# Patient Record
Sex: Male | Born: 1990 | Race: Black or African American | Hispanic: No | Marital: Single | Smoking: Former smoker
Health system: Southern US, Community
[De-identification: ages and names within clinical notes are randomized; demographics above are authoritative.]

## PROBLEM LIST (undated history)

## (undated) DIAGNOSIS — J40 Bronchitis, not specified as acute or chronic: Secondary | ICD-10-CM

## (undated) HISTORY — PX: CYST EXCISION: SHX5701

---
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
## 2010-06-16 ENCOUNTER — Emergency Department (HOSPITAL_COMMUNITY): Admission: EM | Admit: 2010-06-16 | Discharge: 2010-06-16 | Payer: Self-pay | Admitting: Emergency Medicine

## 2014-08-10 ENCOUNTER — Encounter (HOSPITAL_BASED_OUTPATIENT_CLINIC_OR_DEPARTMENT_OTHER): Payer: Self-pay | Admitting: Emergency Medicine

## 2014-08-10 ENCOUNTER — Emergency Department (HOSPITAL_BASED_OUTPATIENT_CLINIC_OR_DEPARTMENT_OTHER)
Admission: EM | Admit: 2014-08-10 | Discharge: 2014-08-10 | Disposition: A | Discharge status: Home / Self Care | Payer: Managed Care, Other (non HMO) | Attending: Emergency Medicine | Admitting: Emergency Medicine

## 2014-08-10 DIAGNOSIS — F17200 Nicotine dependence, unspecified, uncomplicated: Secondary | ICD-10-CM | POA: Diagnosis not present

## 2014-08-10 DIAGNOSIS — S0180XA Unspecified open wound of other part of head, initial encounter: Secondary | ICD-10-CM | POA: Diagnosis not present

## 2014-08-10 DIAGNOSIS — Y929 Unspecified place or not applicable: Secondary | ICD-10-CM | POA: Insufficient documentation

## 2014-08-10 DIAGNOSIS — IMO0002 Reserved for concepts with insufficient information to code with codable children: Secondary | ICD-10-CM | POA: Insufficient documentation

## 2014-08-10 DIAGNOSIS — Y9389 Activity, other specified: Secondary | ICD-10-CM | POA: Insufficient documentation

## 2014-08-10 DIAGNOSIS — S0181XA Laceration without foreign body of other part of head, initial encounter: Secondary | ICD-10-CM

## 2014-08-10 NOTE — ED Notes (Signed)
States ran into filing cabinet  Lac starting aat bridge on nose up over left eye  approx 1.5 to 2 inches,  Bleeding controlled  Denies loc

## 2014-08-10 NOTE — ED Provider Notes (Addendum)
CSN: [id number]     Arrival date & time 08/10/14  0418 History   First MD Initiated Contact with Patient 08/10/14 [phone]     Chief Complaint  Patient presents with  . Facial Laceration     (Consider location/radiation/quality/duration/timing/severity/associated sxs/prior Treatment) HPI This is a 23 year old male who walked into a cabinet just prior to arrival. He struck the center of his forehead and has a laceration there. The laceration is hemostatic after application of pressure. There was no loss of consciousness. He has not been vomiting. He denies neck pain. He does have a headache which is improved after taking 4 Advil tablets.  History reviewed. No pertinent past medical history. History reviewed. No pertinent past surgical history. No family history on file. History  Substance Use Topics  . Smoking status: Current Every Day Smoker  . Smokeless tobacco: Not on file  . Alcohol Use: Yes    Review of Systems  All other systems reviewed and are negative.   Allergies  Review of patient's allergies indicates no known allergies.  Home Medications   Prior to Admission medications   Not on File   BP 130/79  Pulse 86  Temp(Src) 98.2 F (36.8 C) (Oral)  Resp 18  Ht  (1.854 m)  Wt 150 lb (68.04 kg)  BMI 19.79 kg/m2  SpO2 98%  Physical Exam General: Well-developed, well-nourished male in no acute distress; appearance consistent with age of record HENT: normocephalic; vertical laceration to the mid forehead Eyes: pupils equal, round and reactive to light; extraocular muscles intact Neck: supple; nontender Heart: regular rate and rhythm Lungs: Normal respiratory effort and excursion Abdomen: soft; nondistended Extremities: No deformity; full range of motion Neurologic: Awake, alert and oriented; motor function intact in all extremities and symmetric; no facial droop Skin: Warm and dry Psychiatric: Anxious    ED Course  Procedures (including critical care  time)  LACERATION REPAIR Performed by: Mililani Murthy L Authorized by: Hanley Seamen Consent: Verbal consent obtained. Risks and benefits: risks, benefits and alternatives were discussed Consent given by: patient Patient identity confirmed: provided demographic data Prepped and Draped in normal sterile fashion Wound explored  Laceration Location: Mid forehead  Laceration Length: 3 cm  No Foreign Bodies seen or palpated  Anesthesia: local infiltration  Local anesthetic: lidocaine 2 % with epinephrine  Anesthetic total: 3 ml  Irrigation method: syringe Amount of cleaning: standard  Skin closure: 4-0 Prolene, chosen due to depth and irregularity of wound  Number of sutures: 5   Technique: Simple interrupted   Patient tolerance: Patient tolerated the procedure well with no immediate complications.   MDM      Hanley Seamen, MD 08/10/14 0532  Hanley Seamen, MD 08/10/14 6578

## 2014-08-10 NOTE — ED Notes (Signed)
States ran into filing cabinet  approx 1.5 to 2 inch lac from bridge of nose  Over left eye  Bleeding controlled

## 2014-08-10 NOTE — Discharge Instructions (Signed)
Facial Laceration ° A facial laceration is a cut on the face. These injuries can be painful and cause bleeding. Lacerations usually heal quickly, but they need special care to reduce scarring. °DIAGNOSIS  °Your health care provider will take a medical history, ask for details about how the injury occurred, and examine the wound to determine how deep the cut is. °TREATMENT  °Some facial lacerations may not require closure. Others may not be able to be closed because of an increased risk of infection. The risk of infection and the chance for successful closure will depend on various factors, including the amount of time since the injury occurred. °The wound may be cleaned to help prevent infection. If closure is appropriate, pain medicines may be given if needed. Your health care provider will use stitches (sutures), wound glue (adhesive), or skin adhesive strips to repair the laceration. These tools bring the skin edges together to allow for faster healing and a better cosmetic outcome. If needed, you may also be given a tetanus shot. °HOME CARE INSTRUCTIONS °· Only take over-the-counter or prescription medicines as directed by your health care provider. °· Follow your health care provider's instructions for wound care. These instructions will vary depending on the technique used for closing the wound. °For Sutures: °· Keep the wound clean and dry.   °· If you were given a bandage (dressing), you should change it at least once a day. Also change the dressing if it becomes wet or dirty, or as directed by your health care provider.   °· Wash the wound with soap and water 2 times a day. Rinse the wound off with water to remove all soap. Pat the wound dry with a clean towel.   °· After cleaning, apply a thin layer of the antibiotic ointment recommended by your health care provider. This will help prevent infection and keep the dressing from sticking.   °· You may shower as usual after the first 24 hours. Do not soak the  wound in water until the sutures are removed.   °· Get your sutures removed as directed by your health care provider. With facial lacerations, sutures should usually be taken out after 4-5 days to avoid stitch marks.   °· Wait a few days after your sutures are removed before applying any makeup. ° °After Healing: °Once the wound has healed, cover the wound with sunscreen during the day for 1 full year. This can help minimize scarring. Exposure to ultraviolet light in the first year will darken the scar. It can take 1-2 years for the scar to lose its redness and to heal completely.  °SEEK IMMEDIATE MEDICAL CARE IF: °· You have redness, pain, or swelling around the wound.   °· You see a yellowish-white fluid (pus) coming from the wound.   °· You have chills or a fever.   °MAKE SURE YOU: °· Understand these instructions. °· Will watch your condition. °· Will get help right away if you are not doing well or get worse. °Document Released: 12/11/2004 Document Revised: 08/24/2013 Document Reviewed: 06/16/2013 °ExitCare® Patient Information ©2015 [employer]. This information is not intended to replace advice given to you by your health care provider. Make sure you discuss any questions you have with your health care provider. ° °

## 2014-08-10 NOTE — ED Notes (Signed)
Patient called to state he was seen here and treated for a laceration to his face.  No pain medication were prescribed for discharge.  Chart reviewed with Dr. Radford Pax.  Order received for norco, 6 tabs po every 4-6 hours as needed for pain.  Written prescription obtained.  Call placed to patient.

## 2014-08-17 ENCOUNTER — Encounter (HOSPITAL_BASED_OUTPATIENT_CLINIC_OR_DEPARTMENT_OTHER): Payer: Self-pay | Admitting: Emergency Medicine

## 2014-08-17 ENCOUNTER — Emergency Department (HOSPITAL_BASED_OUTPATIENT_CLINIC_OR_DEPARTMENT_OTHER)
Admission: EM | Admit: 2014-08-17 | Discharge: 2014-08-17 | Disposition: A | Discharge status: Home / Self Care | Payer: Managed Care, Other (non HMO) | Attending: Emergency Medicine | Admitting: Emergency Medicine

## 2014-08-17 DIAGNOSIS — Z4802 Encounter for removal of sutures: Secondary | ICD-10-CM | POA: Insufficient documentation

## 2014-08-17 DIAGNOSIS — Z720 Tobacco use: Secondary | ICD-10-CM | POA: Insufficient documentation

## 2014-08-17 DIAGNOSIS — R51 Headache: Secondary | ICD-10-CM | POA: Diagnosis not present

## 2014-08-17 NOTE — ED Notes (Signed)
For suture removal to forehead-placed 9/24

## 2014-08-17 NOTE — Discharge Instructions (Signed)

## 2014-08-17 NOTE — ED Provider Notes (Signed)
CSN: [id number]     Arrival date & time 08/17/14  1710 History   First MD Initiated Contact with Patient 08/17/14 1725     Chief Complaint  Patient presents with  . Suture / Staple Removal     (Consider location/radiation/quality/duration/timing/severity/associated sxs/prior Treatment) Patient is a 23 y.o. male presenting with suture removal. The history is provided by the patient. No language interpreter was used.  Suture / Staple Removal This is a new problem. The current episode started today. The problem occurs constantly. The problem has been gradually worsening. Associated symptoms include headaches. Nothing aggravates the symptoms. He has tried nothing for the symptoms. The treatment provided no relief.  Pt here for suture removal  History reviewed. No pertinent past medical history. History reviewed. No pertinent past surgical history. No family history on file. History  Substance Use Topics  . Smoking status: Current Every Day Smoker  . Smokeless tobacco: Not on file  . Alcohol Use: Yes    Review of Systems  Neurological: Positive for headaches.  All other systems reviewed and are negative.     Allergies  Review of patient's allergies indicates no known allergies.  Home Medications   Prior to Admission medications   Not on File   BP 129/82  Pulse 97  Temp(Src) 98.7 F (37.1 C) (Oral)  Resp 18  Ht 6\' 1"  (1.854 m)  Wt 147 lb (66.679 kg)  BMI 19.40 kg/m2  SpO2 100% Physical Exam  Nursing note and vitals reviewed. Constitutional: He is oriented to person, place, and time. He appears well-developed and well-nourished.  HENT:  Head: Normocephalic.  Healing laceration forehead  Musculoskeletal: Normal range of motion.  Neurological: He is alert and oriented to person, place, and time.  Skin: Skin is warm.  Psychiatric: He has a normal mood and affect.    ED Course  Procedures (including critical care time) Labs Review Labs Reviewed - No data to  display  Imaging Review No results found.   EKG Interpretation None      MDM   Final diagnoses:  Visit for suture removal        Elson AreasLeslie K Winferd Wease, PA-C 08/17/14 1734

## 2014-08-19 NOTE — ED Provider Notes (Signed)
Medical screening examination/treatment/procedure(s) were performed by non-physician practitioner and as supervising physician I was immediately available for consultation/collaboration.   EKG Interpretation None        Shon Batonourtney F Adelina Collard, MD 08/19/14 1447

## 2015-08-26 ENCOUNTER — Emergency Department (HOSPITAL_BASED_OUTPATIENT_CLINIC_OR_DEPARTMENT_OTHER)
Admission: EM | Admit: 2015-08-26 | Discharge: 2015-08-26 | Disposition: A | Discharge status: Home / Self Care | Payer: Managed Care, Other (non HMO) | Attending: Emergency Medicine | Admitting: Emergency Medicine

## 2015-08-26 ENCOUNTER — Encounter (HOSPITAL_BASED_OUTPATIENT_CLINIC_OR_DEPARTMENT_OTHER): Payer: Self-pay | Admitting: Emergency Medicine

## 2015-08-26 DIAGNOSIS — L02416 Cutaneous abscess of left lower limb: Secondary | ICD-10-CM | POA: Insufficient documentation

## 2015-08-26 DIAGNOSIS — R062 Wheezing: Secondary | ICD-10-CM | POA: Insufficient documentation

## 2015-08-26 DIAGNOSIS — L03311 Cellulitis of abdominal wall: Secondary | ICD-10-CM | POA: Diagnosis not present

## 2015-08-26 DIAGNOSIS — L0390 Cellulitis, unspecified: Secondary | ICD-10-CM

## 2015-08-26 DIAGNOSIS — L02211 Cutaneous abscess of abdominal wall: Secondary | ICD-10-CM | POA: Diagnosis not present

## 2015-08-26 DIAGNOSIS — L0291 Cutaneous abscess, unspecified: Secondary | ICD-10-CM

## 2015-08-26 DIAGNOSIS — Z8709 Personal history of other diseases of the respiratory system: Secondary | ICD-10-CM | POA: Insufficient documentation

## 2015-08-26 DIAGNOSIS — L03116 Cellulitis of left lower limb: Secondary | ICD-10-CM | POA: Insufficient documentation

## 2015-08-26 DIAGNOSIS — Z720 Tobacco use: Secondary | ICD-10-CM | POA: Insufficient documentation

## 2015-08-26 MED ORDER — SULFAMETHOXAZOLE-TRIMETHOPRIM 800-160 MG PO TABS: 1.0000 | ORAL_TABLET | Freq: Two times a day (BID) | ORAL | Status: AC

## 2015-08-26 MED ORDER — IBUPROFEN 400 MG PO TABS
600.0000 mg | ORAL_TABLET | Freq: Once | ORAL | Status: AC
  Administered 2015-08-26: 600 mg via ORAL
  Filled 2015-08-26 (×2): qty 1

## 2015-08-26 MED ORDER — ACETAMINOPHEN 325 MG PO TABS
650.0000 mg | ORAL_TABLET | Freq: Once | ORAL | Status: AC
  Administered 2015-08-26: 650 mg via ORAL
  Filled 2015-08-26: qty 2

## 2015-08-26 MED ORDER — LIDOCAINE-EPINEPHRINE (PF) 2 %-1:200000 IJ SOLN
10.0000 mL | Freq: Once | INTRAMUSCULAR | Status: AC
  Administered 2015-08-26: 10 mL
  Filled 2015-08-26: qty 10

## 2015-08-26 NOTE — ED Provider Notes (Signed)
CSN: [id number]     Arrival date & time 08/26/15  2041 History  By signing my name below, I, Budd Palmer, attest that this documentation has been prepared under the direction and in the presence of [employer], PA-C . Electronically Signed: Budd Palmer, ED Scribe. 08/26/2015. 9:26 PM.    Chief Complaint  Patient presents with  . Abscess   The history is provided by the patient. No language interpreter was used.   HPI Comments: Darrell Garcia is a 24 y.o. male smoker who presents to the Emergency Department complaining of two worsening, painful sores on his lower abdomen and left inner thigh onset 5 days ago. Pt states they started out as pimples and then worsened. He notes he was seen by a health clinic for a suspected STD, but was told it was a form of dermatitis. He states he took a percocet with moderate relief. He notes a PMHx of bronchitis. Pt denies n/v, penile discharge, pain, scrotal pain or swelling, and fever.   History reviewed. No pertinent past medical history. History reviewed. No pertinent past surgical history. No family history on file. Social History  Substance Use Topics  . Smoking status: Current Every Day Smoker  . Smokeless tobacco: None  . Alcohol Use: Yes    Review of Systems  Constitutional: Negative for fever.  Gastrointestinal: Negative for nausea and vomiting.  Genitourinary: Negative for discharge.  Skin: Positive for color change.  All other systems reviewed and are negative.   Allergies  Review of patient's allergies indicates no known allergies.  Home Medications   Prior to Admission medications   Medication Sig Start Date End Date Taking? Authorizing Provider  sulfamethoxazole-trimethoprim (BACTRIM DS,SEPTRA DS) 800-160 MG tablet Take 1 tablet by mouth 2 (two) times daily. 08/26/15 09/02/15  Joycie Peek, PA-C   BP 129/85 mmHg  Pulse 58  Temp(Src) 98.6 F (37 C) (Oral)  Resp 16  Ht 6' (1.829 m)  Wt 145 lb (65.772 kg)  BMI  19.66 kg/m2  SpO2 100% Physical Exam  Constitutional: He is oriented to person, place, and time. He appears well-developed and well-nourished.  HENT:  Head: Normocephalic.  Eyes: EOM are normal.  Neck: Normal range of motion.  Cardiovascular: Normal rate, regular rhythm and normal heart sounds.   Pulmonary/Chest: Effort normal. He has wheezes (mild in bases).  Abdominal: Soft. He exhibits no distension. There is no tenderness.  One small abscess over suprapubic region, one abscess over L proximal thigh  Genitourinary:  Normal male GU exam. No lesions or sores or discharge. No scrotal pain, erythema or other abnormalities.  Musculoskeletal: Normal range of motion.  Neurological: He is alert and oriented to person, place, and time.  Psychiatric: He has a normal mood and affect.  Nursing note and vitals reviewed.   ED Course  Procedures .  INCISION AND DRAINAGE Performed by: Sharlene Motts Consent: Verbal consent obtained. Risks and benefits: risks, benefits and alternatives were discussed Type: abscess  Body area: Left pubic region  Anesthesia: local infiltration  Incision was made with a scalpel.  Local anesthetic: lidocaine 2 % with epinephrine  Anesthetic total: 2 ml  Complexity: complex Blunt dissection to break up loculations  Drainage: purulent  Drainage amount: Small   Packing material: None   Patient tolerance: Patient tolerated the procedure well with no immediate complications.   INCISION AND DRAINAGE Performed by: Sharlene Motts Consent: Verbal consent obtained. Risks and benefits: risks, benefits and alternatives were discussed Type: abscess  Body area:  Left proximal thigh  Anesthesia: local infiltration  Incision was made with a scalpel.  Local anesthetic: lidocaine 2 % with epinephrine  Anesthetic total: 2 ml  Complexity: complex Blunt dissection to break up loculations  Drainage: purulent  Drainage amount: Moderate    Packing material: 1/4 in iodoform gauze  Patient tolerance: Patient tolerated the procedure well with no immediate complications.   DIAGNOSTIC STUDIES: Oxygen Saturation is 100% on RA, normal by my interpretation.    COORDINATION OF CARE: 9:14 PM - Discussed plans to perform I&D's on each of the sores. Pt advised of plan for treatment and pt agrees.  Labs Review Labs Reviewed - No data to display  Imaging Review No results found. I have personally reviewed and evaluated these images and lab results as part of my medical decision-making.   EKG Interpretation None     Meds given in ED:  Medications  lidocaine-EPINEPHrine (XYLOCAINE W/EPI) 2 %-1:200000 (PF) injection 10 mL (10 mLs Infiltration Given by Other 08/26/15 2152)  ibuprofen (ADVIL,MOTRIN) tablet 600 mg (600 mg Oral Given 08/26/15 2247)  acetaminophen (TYLENOL) tablet 650 mg (650 mg Oral Given 08/26/15 2308)    Discharge Medication List as of 08/26/2015 10:17 PM    START taking these medications   Details  sulfamethoxazole-trimethoprim (BACTRIM DS,SEPTRA DS) 800-160 MG tablet Take 1 tablet by mouth 2 (two) times daily., Starting 08/26/2015, Until Sun 09/02/15, Print       Filed Vitals:   08/26/15 2044 08/26/15 2225  BP: 128/81 129/85  Pulse: 72 58  Temp: 97.8 F (36.6 C) 98.6 F (37 C)  TempSrc: Oral Oral  Resp: 18 16  Height: 6' (1.829 m)   Weight: 145 lb (65.772 kg)   SpO2: 100% 100%    MDM  Vitals stable - WNL -afebrile Pt resting comfortably in ED. Patient with 2 abscesses, drained a bedside. One required packing. Discussed with patient he may remove packing 2 days later. There is surrounding mild cellulitis. Will initiate antibiotic therapy. Discussed follow-up with his doctor in one week for reevaluation. I discussed all relevant lab findings and imaging results with pt and they verbalized understanding. Discussed f/u with PCP within 48 hrs and return precautions, pt very amenable to plan.  Final  diagnoses:  Abscess and cellulitis    I personally performed the services described in this documentation, which was scribed in my presence. The recorded information has been reviewed and is accurate.   Joycie Peek, PA-C 08/27/15 0010  Raeford Razor, MD 08/30/15 1003

## 2015-08-26 NOTE — Discharge Instructions (Signed)
You may remove your packing in 2 days. Please take your antibiotics as prescribed. Follow-up with your doctor as needed. Return to ED for worsening symptoms.  Abscess An abscess is an infected area that contains a collection of pus and debris.It can occur in almost any part of the body. An abscess is also known as a furuncle or boil. CAUSES  An abscess occurs when tissue gets infected. This can occur from blockage of oil or sweat glands, infection of hair follicles, or a minor injury to the skin. As the body tries to fight the infection, pus collects in the area and creates pressure under the skin. This pressure causes pain. People with weakened immune systems have difficulty fighting infections and get certain abscesses more often.  SYMPTOMS Usually an abscess develops on the skin and becomes a painful mass that is red, warm, and tender. If the abscess forms under the skin, you may feel a moveable soft area under the skin. Some abscesses break open (rupture) on their own, but most will continue to get worse without care. The infection can spread deeper into the body and eventually into the bloodstream, causing you to feel ill.  DIAGNOSIS  Your caregiver will take your medical history and perform a physical exam. A sample of fluid may also be taken from the abscess to determine what is causing your infection. TREATMENT  Your caregiver may prescribe antibiotic medicines to fight the infection. However, taking antibiotics alone usually does not cure an abscess. Your caregiver may need to make a small cut (incision) in the abscess to drain the pus. In some cases, gauze is packed into the abscess to reduce pain and to continue draining the area. HOME CARE INSTRUCTIONS   Only take over-the-counter or prescription medicines for pain, discomfort, or fever as directed by your caregiver.  If you were prescribed antibiotics, take them as directed. Finish them even if you start to feel better.  If gauze is  used, follow your caregiver's directions for changing the gauze.  To avoid spreading the infection:  Keep your draining abscess covered with a bandage.  Wash your hands well.  Do not share personal care items, towels, or whirlpools with others.  Avoid skin contact with others.  Keep your skin and clothes clean around the abscess.  Keep all follow-up appointments as directed by your caregiver. SEEK MEDICAL CARE IF:   You have increased pain, swelling, redness, fluid drainage, or bleeding.  You have muscle aches, chills, or a general ill feeling.  You have a fever. MAKE SURE YOU:   Understand these instructions.  Will watch your condition.  Will get help right away if you are not doing well or get worse.   This information is not intended to replace advice given to you by your health care provider. Make sure you discuss any questions you have with your health care provider.   Document Released: 08/13/2005 Document Revised: 05/04/2012 Document Reviewed: 01/16/2012 Elsevier Interactive Patient Education 2016 [employer]  Cellulitis Cellulitis is an infection of the skin and the tissue beneath it. The infected area is usually red and tender. Cellulitis occurs most often in the arms and lower legs.  CAUSES  Cellulitis is caused by bacteria that enter the skin through cracks or cuts in the skin. The most common types of bacteria that cause cellulitis are staphylococci and streptococci. SIGNS AND SYMPTOMS   Redness and warmth.  Swelling.  Tenderness or pain.  Fever. DIAGNOSIS  Your health care provider can usually  determine what is wrong based on a physical exam. Blood tests may also be done. TREATMENT  Treatment usually involves taking an antibiotic medicine. HOME CARE INSTRUCTIONS   Take your antibiotic medicine as directed by your health care provider. Finish the antibiotic even if you start to feel better.  Keep the infected arm or leg elevated to reduce  swelling.  Apply a warm cloth to the affected area up to 4 times per day to relieve pain.  Take medicines only as directed by your health care provider.  Keep all follow-up visits as directed by your health care provider. SEEK MEDICAL CARE IF:   You notice red streaks coming from the infected area.  Your red area gets larger or turns dark in color.  Your bone or joint underneath the infected area becomes painful after the skin has healed.  Your infection returns in the same area or another area.  You notice a swollen bump in the infected area.  You develop new symptoms.  You have a fever. SEEK IMMEDIATE MEDICAL CARE IF:   You feel very sleepy.  You develop vomiting or diarrhea.  You have a general ill feeling (malaise) with muscle aches and pains.   This information is not intended to replace advice given to you by your health care provider. Make sure you discuss any questions you have with your health care provider.   Document Released: 08/13/2005 Document Revised: 07/25/2015 Document Reviewed: 01/19/2012 Elsevier Interactive Patient Education [employer]

## 2015-08-26 NOTE — ED Notes (Signed)
C/o L inguinal abscesses, onset 2d ago, c/o pain, (denies: nv, fever, cramping or dizziness), took a percocet PTA.

## 2015-08-26 NOTE — ED Notes (Signed)
EDPA in to see pt 

## 2015-08-26 NOTE — ED Notes (Signed)
Pt has sores on abd.

## 2016-08-26 ENCOUNTER — Ambulatory Visit (INDEPENDENT_AMBULATORY_CARE_PROVIDER_SITE_OTHER): Payer: Self-pay

## 2016-08-26 ENCOUNTER — Ambulatory Visit (HOSPITAL_COMMUNITY)
Admission: EM | Admit: 2016-08-26 | Discharge: 2016-08-26 | Disposition: A | Discharge status: Home / Self Care | Payer: Managed Care, Other (non HMO) | Attending: Physician Assistant | Admitting: Physician Assistant

## 2016-08-26 ENCOUNTER — Encounter (HOSPITAL_COMMUNITY): Payer: Self-pay | Admitting: Emergency Medicine

## 2016-08-26 DIAGNOSIS — M791 Myalgia: Secondary | ICD-10-CM | POA: Diagnosis not present

## 2016-08-26 DIAGNOSIS — M542 Cervicalgia: Secondary | ICD-10-CM

## 2016-08-26 DIAGNOSIS — M7918 Myalgia, other site: Secondary | ICD-10-CM

## 2016-08-26 HISTORY — DX: Bronchitis, not specified as acute or chronic: J40

## 2016-08-26 MED ORDER — HYDROCODONE-ACETAMINOPHEN 5-325 MG PO TABS: 2.0000 | ORAL_TABLET | ORAL | 0 refills | Status: DC | PRN

## 2016-08-26 MED ORDER — CYCLOBENZAPRINE HCL 10 MG PO TABS: 10.0000 mg | ORAL_TABLET | Freq: Two times a day (BID) | ORAL | 0 refills | Status: DC | PRN
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
## 2016-08-26 NOTE — Discharge Instructions (Signed)
Your x-rays do not show any bony injury.   Symptomatic treatment at home.
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
## 2016-11-17 ENCOUNTER — Emergency Department (HOSPITAL_BASED_OUTPATIENT_CLINIC_OR_DEPARTMENT_OTHER)
Admission: EM | Admit: 2016-11-17 | Discharge: 2016-11-18 | Disposition: A | Discharge status: Home / Self Care | Payer: Self-pay | Attending: Emergency Medicine | Admitting: Emergency Medicine

## 2016-11-17 ENCOUNTER — Emergency Department (HOSPITAL_BASED_OUTPATIENT_CLINIC_OR_DEPARTMENT_OTHER): Payer: Self-pay

## 2016-11-17 ENCOUNTER — Encounter (HOSPITAL_BASED_OUTPATIENT_CLINIC_OR_DEPARTMENT_OTHER): Payer: Self-pay | Admitting: *Deleted

## 2016-11-17 DIAGNOSIS — Y999 Unspecified external cause status: Secondary | ICD-10-CM | POA: Insufficient documentation

## 2016-11-17 DIAGNOSIS — Z5181 Encounter for therapeutic drug level monitoring: Secondary | ICD-10-CM | POA: Insufficient documentation

## 2016-11-17 DIAGNOSIS — S0990XA Unspecified injury of head, initial encounter: Secondary | ICD-10-CM | POA: Insufficient documentation

## 2016-11-17 DIAGNOSIS — S01111A Laceration without foreign body of right eyelid and periocular area, initial encounter: Secondary | ICD-10-CM | POA: Insufficient documentation

## 2016-11-17 DIAGNOSIS — Y9339 Activity, other involving climbing, rappelling and jumping off: Secondary | ICD-10-CM | POA: Insufficient documentation

## 2016-11-17 DIAGNOSIS — F17200 Nicotine dependence, unspecified, uncomplicated: Secondary | ICD-10-CM | POA: Insufficient documentation

## 2016-11-17 DIAGNOSIS — Y929 Unspecified place or not applicable: Secondary | ICD-10-CM | POA: Insufficient documentation

## 2016-11-17 DIAGNOSIS — S01511A Laceration without foreign body of lip, initial encounter: Secondary | ICD-10-CM | POA: Insufficient documentation

## 2016-11-17 DIAGNOSIS — S0181XA Laceration without foreign body of other part of head, initial encounter: Secondary | ICD-10-CM

## 2016-11-17 LAB — CBC WITH DIFFERENTIAL/PLATELET
BASOS ABS: 0 10*3/uL (ref 0.0–0.1)
Basophils Relative: 0 %
EOS PCT: 0 %
Eosinophils Absolute: 0 10*3/uL (ref 0.0–0.7)
HEMATOCRIT: 38 % — AB (ref 39.0–52.0)
Hemoglobin: 12.4 g/dL — ABNORMAL LOW (ref 13.0–17.0)
LYMPHS ABS: 1.5 10*3/uL (ref 0.7–4.0)
LYMPHS PCT: 23 %
MCH: 22.5 pg — ABNORMAL LOW (ref 26.0–34.0)
MCHC: 32.6 g/dL (ref 30.0–36.0)
MCV: 69 fL — AB (ref 78.0–100.0)
Monocytes Absolute: 0.6 10*3/uL (ref 0.1–1.0)
Monocytes Relative: 9 %
Neutro Abs: 4.3 10*3/uL (ref 1.7–7.7)
Neutrophils Relative %: 68 %
Platelets: 318 10*3/uL (ref 150–400)
RBC: 5.51 MIL/uL (ref 4.22–5.81)
RDW: 17.3 % — AB (ref 11.5–15.5)
WBC: 6.4 10*3/uL (ref 4.0–10.5)

## 2016-11-17 LAB — COMPREHENSIVE METABOLIC PANEL
ALBUMIN: 4.2 g/dL (ref 3.5–5.0)
ALT: 21 U/L (ref 17–63)
AST: 41 U/L (ref 15–41)
Alkaline Phosphatase: 57 U/L (ref 38–126)
Anion gap: 7 (ref 5–15)
BILIRUBIN TOTAL: 0.8 mg/dL (ref 0.3–1.2)
BUN: 12 mg/dL (ref 6–20)
CALCIUM: 8.6 mg/dL — AB (ref 8.9–10.3)
CO2: 24 mmol/L (ref 22–32)
CREATININE: 1.13 mg/dL (ref 0.61–1.24)
Chloride: 106 mmol/L (ref 101–111)
GFR calc Af Amer: >60 mL/min (ref >60)
GFR calc non Af Amer: >60 mL/min (ref >60)
Glucose, Bld: 80 mg/dL (ref 65–99)
Potassium: 3.9 mmol/L (ref 3.5–5.1)
SODIUM: 137 mmol/L (ref 135–145)
TOTAL PROTEIN: 7 g/dL (ref 6.5–8.1)

## 2016-11-17 LAB — RAPID URINE DRUG SCREEN, HOSP PERFORMED
AMPHETAMINES: NOT DETECTED
BENZODIAZEPINES: NOT DETECTED
Barbiturates: NOT DETECTED
Cocaine: NOT DETECTED
Opiates: NOT DETECTED
Tetrahydrocannabinol: POSITIVE — AB

## 2016-11-17 LAB — ETHANOL: Alcohol, Ethyl (B): <5 mg/dL (ref <5)

## 2016-11-17 MED ORDER — LIDOCAINE HCL 2 % IJ SOLN
10.0000 mL | Freq: Once | INTRAMUSCULAR | Status: AC
Start: 2016-11-17 — End: 2016-11-17
  Administered 2016-11-17: 200 mg
  Filled 2016-11-17: qty 20

## 2016-11-17 MED ORDER — IBUPROFEN 400 MG PO TABS
600.0000 mg | ORAL_TABLET | Freq: Once | ORAL | Status: AC
  Administered 2016-11-17: 600 mg via ORAL
  Filled 2016-11-17: qty 1

## 2016-11-17 MED ORDER — LIDOCAINE-EPINEPHRINE-TETRACAINE (LET) SOLUTION
3.0000 mL | Freq: Once | NASAL | Status: AC
  Administered 2016-11-17: 3 mL via TOPICAL
  Filled 2016-11-17: qty 3
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
## 2016-11-17 NOTE — ED Provider Notes (Signed)
LACERATION REPAIR Performed by: Lona KettleAshley Laurel Atavia Poppe Authorized by: Lona KettleAshley Laurel Mehkai Gallo Consent: Verbal consent obtained. Risks and benefits: risks, benefits and alternatives were discussed Consent given by: patient Patient identity confirmed: provided demographic data Prepped and Draped in normal sterile fashion Wound explored  Laceration Location: right lateral eyebrow  Laceration Length: 1.5cm  No Foreign Bodies seen or palpated  Anesthesia: local infiltration  Local anesthetic: lidocaine 1% w/o epinephrine, LET  Anesthetic total: 5 ml  Irrigation method: syringe Amount of cleaning: standard  Skin closure: dermal  Number of sutures: 2  Technique: simple interrupted  Patient tolerance: Patient tolerated the procedure well with no immediate complications.  LACERATION REPAIR Performed by: Lona KettleAshley Laurel Luke Rigsbee Authorized by: Lona KettleAshley Laurel Tyresa Prindiville Consent: Verbal consent obtained. Risks and benefits: risks, benefits and alternatives were discussed Consent given by: patient Patient identity confirmed: provided demographic data Prepped and Draped in normal sterile fashion Wound explored  Laceration Location: midline upper lip  Laceration Length: 1cm  No Foreign Bodies seen or palpated  Anesthesia: local infiltration  Local anesthetic: lidocaine 1% w/o epinephrine  Anesthetic total: 3 ml  Irrigation method: syringe Amount of cleaning: standard  Skin closure: dermal  Number of sutures: 4  Technique: simple interrupted  Patient tolerance: Patient tolerated the procedure well with no immediate complications.    Lona KettleAshley Laurel Aurie Harroun, PA-C 11/17/16 2308    Lavera Guiseana Duo Liu, MD 11/18/16 1330
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
## 2016-11-17 NOTE — ED Notes (Signed)
Patient transported to CT 

## 2016-11-19 ENCOUNTER — Emergency Department (HOSPITAL_COMMUNITY)
Admission: EM | Admit: 2016-11-19 | Discharge: 2016-11-19 | Disposition: A | Discharge status: Home / Self Care | Payer: Self-pay | Attending: Emergency Medicine | Admitting: Emergency Medicine

## 2016-11-19 ENCOUNTER — Ambulatory Visit (HOSPITAL_COMMUNITY)
Admission: EM | Admit: 2016-11-19 | Discharge: 2016-11-19 | Disposition: A | Discharge status: Other Acute Inpatient Hospital | Payer: Self-pay

## 2016-11-19 ENCOUNTER — Encounter (HOSPITAL_COMMUNITY): Payer: Self-pay | Admitting: Emergency Medicine

## 2016-11-19 DIAGNOSIS — Z4801 Encounter for change or removal of surgical wound dressing: Secondary | ICD-10-CM | POA: Insufficient documentation

## 2016-11-19 DIAGNOSIS — R52 Pain, unspecified: Secondary | ICD-10-CM | POA: Insufficient documentation

## 2016-11-19 DIAGNOSIS — Z5189 Encounter for other specified aftercare: Secondary | ICD-10-CM

## 2016-11-19 DIAGNOSIS — F17200 Nicotine dependence, unspecified, uncomplicated: Secondary | ICD-10-CM | POA: Insufficient documentation

## 2016-11-19 MED ORDER — NAPROXEN 500 MG PO TABS: 500.0000 mg | ORAL_TABLET | Freq: Two times a day (BID) | ORAL | 0 refills | Status: DC

## 2016-11-19 MED ORDER — HYDROCODONE-ACETAMINOPHEN 5-325 MG PO TABS: 1.0000 | ORAL_TABLET | ORAL | 0 refills | Status: DC | PRN

## 2016-11-19 MED ORDER — LIDOCAINE VISCOUS 2 % MT SOLN: 20.0000 mL | OROMUCOSAL | 0 refills | Status: DC | PRN

## 2016-11-19 NOTE — ED Triage Notes (Signed)
Pt requesting that his suture lines on face and lip be examined. Pt is c/o generalized muscle pain and jaw pain

## 2016-11-19 NOTE — Discharge Instructions (Signed)
Start taking Naprosyn twice daily for pain. For severe pain, take Norco every 4-6 hours. Swish the viscous lidocaine around in your mouth to help with your lip pain. Please return to the ED or urgent care in 2 days for suture removal. Return to ED sooner if you experience any new or worsening symptoms.
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
## 2016-11-21 ENCOUNTER — Telehealth (HOSPITAL_BASED_OUTPATIENT_CLINIC_OR_DEPARTMENT_OTHER): Payer: Self-pay | Admitting: *Deleted

## 2016-11-21 NOTE — Telephone Encounter (Signed)
Pt called asking when his sutures (that were placed on Jan 1) should come out. Pt declined to leave number for callback so placed him on hold while chart was reviewed by Rhea BleacherJosh Geiple PA-C who states they should be removed tomorrow. Pt had hung up when attempted to resume phone call and give instructions

## 2017-04-01 ENCOUNTER — Emergency Department (HOSPITAL_COMMUNITY)
Admission: EM | Admit: 2017-04-01 | Discharge: 2017-04-01 | Disposition: A | Discharge status: Home / Self Care | Payer: Self-pay | Attending: Emergency Medicine | Admitting: Emergency Medicine

## 2017-04-01 ENCOUNTER — Emergency Department (HOSPITAL_COMMUNITY): Payer: Self-pay

## 2017-04-01 DIAGNOSIS — Z79899 Other long term (current) drug therapy: Secondary | ICD-10-CM | POA: Insufficient documentation

## 2017-04-01 DIAGNOSIS — M545 Low back pain, unspecified: Secondary | ICD-10-CM

## 2017-04-01 DIAGNOSIS — F17200 Nicotine dependence, unspecified, uncomplicated: Secondary | ICD-10-CM | POA: Insufficient documentation

## 2017-04-01 LAB — CBG MONITORING, ED: Glucose-Capillary: 75 mg/dL (ref 65–99)

## 2017-04-01 MED ORDER — IBUPROFEN 800 MG PO TABS
800.0000 mg | ORAL_TABLET | Freq: Once | ORAL | Status: AC
  Administered 2017-04-01: 800 mg via ORAL
  Filled 2017-04-01: qty 1

## 2017-04-01 MED ORDER — METHOCARBAMOL 750 MG PO TABS: 750.0000 mg | ORAL_TABLET | Freq: Four times a day (QID) | ORAL | 0 refills | Status: DC

## 2017-04-01 MED ORDER — DIAZEPAM 5 MG PO TABS
5.0000 mg | ORAL_TABLET | Freq: Once | ORAL | Status: AC
  Administered 2017-04-01: 5 mg via ORAL
  Filled 2017-04-01: qty 1

## 2017-04-01 MED ORDER — PREDNISONE 10 MG (21) PO TBPK: ORAL_TABLET | Freq: Every day | ORAL | 0 refills | Status: DC

## 2017-04-01 NOTE — ED Triage Notes (Signed)
PA Mardella LaymanLindsey suggested patient be seen in Main ED due to possibly needing an MRI.

## 2017-04-01 NOTE — ED Triage Notes (Addendum)
Pt c/o pain in lower back / sacral area x 2 days. Pt states he has had this pain before r/t a fall 6 months ago. Pt describes the pain as excruciating when present, has attempted heat, no relief. Pt states he smoked marijuana yesterday which helped the pain.

## 2017-04-01 NOTE — ED Provider Notes (Addendum)
[facility] Provider Note   CSN: [id number] Arrival date & time: 04/01/17  1045     History   Chief Complaint Chief Complaint  Patient presents with  . Back Pain    HPI Darrell Garcia is a 26 y.o. male.  26 year old male presents with several day history of right-sided lower back pain localized to his SI region. Patient denies any recent history of trauma but states that he had a remote injury about 6 months ago when he fell. No bowel or bladder dysfunction. Pain does not radiate down his right leg. No foot drop noted. Pain worse with movement and better with rest. Patient did use marijuana which made his symptoms feel better. Denies any urinary symptoms.      Past Medical History:  Diagnosis Date  . Bronchitis     There are no active problems to display for this patient.   Past Surgical History:  Procedure Laterality Date  . CYST EXCISION         Home Medications    Prior to Admission medications   Medication Sig Start Date End Date Taking? Authorizing Provider  HYDROcodone-acetaminophen (NORCO/VICODIN) 5-325 MG tablet Take 1 tablet by mouth every 4 (four) hours as needed. 11/19/16   Cheri Fowler, PA-C  lidocaine (XYLOCAINE) 2 % solution Use as directed 20 mLs in the mouth or throat as needed for mouth pain. 11/19/16   Cheri Fowler, PA-C  naproxen (NAPROSYN) 500 MG tablet Take 1 tablet (500 mg total) by mouth 2 (two) times daily. 11/19/16   Cheri Fowler, PA-C    Family History No family history on file.  Social History Social History  Substance Use Topics  . Smoking status: Current Every Day Smoker    Packs/day: 0.50  . Smokeless tobacco: Never Used  . Alcohol use Yes     Allergies   Patient has no allergy information on record.   Review of Systems Review of Systems  All other systems reviewed and are negative.    Physical Exam Updated Vital Signs BP 120/90 (BP Location: Left Arm)   Pulse (!) 59   Temp 98.1 F (36.7 C) (Oral)   Resp 16    SpO2 100%   Physical Exam  Constitutional: He is oriented to person, place, and time. He appears well-developed and well-nourished.  Non-toxic appearance. No distress.  HENT:  Head: Normocephalic and atraumatic.  Eyes: Conjunctivae, EOM and lids are normal. Pupils are equal, round, and reactive to light.  Neck: Normal range of motion. Neck supple. No tracheal deviation present. No thyroid mass present.  Cardiovascular: Normal rate, regular rhythm and normal heart sounds.  Exam reveals no gallop.   No murmur heard. Pulmonary/Chest: Effort normal and breath sounds normal. No stridor. No respiratory distress. He has no decreased breath sounds. He has no wheezes. He has no rhonchi. He has no rales.  Abdominal: Soft. Normal appearance and bowel sounds are normal. He exhibits no distension. There is no tenderness. There is no rebound and no CVA tenderness.  Musculoskeletal: Normal range of motion. He exhibits no edema or tenderness.  Neurological: He is alert and oriented to person, place, and time. He has normal strength. No cranial nerve deficit or sensory deficit. GCS eye subscore is 4. GCS verbal subscore is 5. GCS motor subscore is 6.  Skin: Skin is warm and dry. No abrasion and no rash noted.  Psychiatric: He has a normal mood and affect. His speech is normal and behavior is normal.  Nursing note and vitals  reviewed.    ED Treatments / Results  Labs (all labs ordered are listed, but only abnormal results are displayed) Labs Reviewed - No data to display  EKG  EKG Interpretation None       Radiology No results found.  Procedures Procedures (including critical care time)  Medications Ordered in ED Medications  ibuprofen (ADVIL,MOTRIN) tablet 800 mg (not administered)  diazepam (VALIUM) tablet 5 mg (not administered)     Initial Impression / Assessment and Plan / ED Course  I have reviewed the triage vital signs and the nursing notes.  Pertinent labs & imaging results  that were available during my care of the patient were reviewed by me and considered in my medical decision making (see chart for details).     Patient's lumbar spine noted and no acute fractures. Does have some disc disease. We placed on course of prednisone and muscle relaxants and given orthopedic referral  1:38 PM She has secondary complaint of having syncope doesn't intermittent 9 years. Patient's EKG shows no signs of QT prolongation. CBG was 75. Patient's orthostatic are mild. He admits to being dehydrated at this time. Was encouraged to follow-up his Dr. as needed  ED ECG REPORT   Date: 04/01/2017  Rate: 66  Rhythm: normal sinus rhythm  QRS Axis: normal  Intervals: normal  ST/T Wave abnormalities: early repolarization  Conduction Disutrbances:none  Narrative Interpretation:   Old EKG Reviewed: none available  I have personally reviewed the EKG tracing and agree with the computerized printout as noted.   Final Clinical Impressions(s) / ED Diagnoses   Final diagnoses:  None    New Prescriptions New Prescriptions   No medications on file     Lorre NickAllen, Cassi Jenne, MD 04/01/17 1258    Lorre NickAllen, Kedric Bumgarner, MD 04/01/17 1339
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
## 2022-12-13 ENCOUNTER — Ambulatory Visit
Admission: EM | Admit: 2022-12-13 | Discharge: 2022-12-13 | Disposition: A | Discharge status: Home / Self Care | Payer: Self-pay | Attending: Nurse Practitioner | Admitting: Nurse Practitioner

## 2022-12-13 DIAGNOSIS — N489 Disorder of penis, unspecified: Secondary | ICD-10-CM | POA: Insufficient documentation

## 2022-12-13 DIAGNOSIS — Z113 Encounter for screening for infections with a predominantly sexual mode of transmission: Secondary | ICD-10-CM | POA: Insufficient documentation

## 2022-12-13 NOTE — Discharge Instructions (Signed)
The clinical contact you for any positive results Follow-up with your PCP as needed

## 2022-12-13 NOTE — ED Triage Notes (Addendum)
Pt requesting to be tested for STDs (including bloodwork), he states he has patches of dry areas to his penis.   Started: about 2 weeks ago   Home interventions: application of oil

## 2022-12-13 NOTE — ED Provider Notes (Signed)
[facility]    CSN: [id number] Arrival date & time: 12/13/22  1010      History   Chief Complaint Chief Complaint  Patient presents with   SEXUALLY TRANSMITTED DISEASE    HPI Keyan Folson is a 32 y.o. male presents for evaluation of STD screening/penile bump.  Patient reports 1 and half weeks ago he noticed some dry patches of skin on the left side of his penis as well as in the distal end of the penis.  The seem to improve with coconut oil.  Couple days ago he noticed some nonpainful bumps to the base of the right penis.  Denies any swelling, drainage.  No dysuria.  No testicular pain or swelling or penile discharge.  He is sexually active but denies any known STD exposure.  No other concerns at this time.  HPI  Past Medical History:  Diagnosis Date   Bronchitis     There are no problems to display for this patient.   Past Surgical History:  Procedure Laterality Date   CYST EXCISION         Home Medications    Prior to Admission medications   Medication Sig Start Date End Date Taking? Authorizing Provider  HYDROcodone-acetaminophen (NORCO/VICODIN) 5-325 MG tablet Take 1 tablet by mouth every 4 (four) hours as needed. Patient not taking: Reported on 04/01/2017 11/19/16   Gloriann Loan, PA-C  lidocaine (XYLOCAINE) 2 % solution Use as directed 20 mLs in the mouth or throat as needed for mouth pain. Patient not taking: Reported on 04/01/2017 11/19/16   Gloriann Loan, PA-C  methocarbamol (ROBAXIN-750) 750 MG tablet Take 1 tablet (750 mg total) by mouth 4 (four) times daily. 04/01/17   Lacretia Leigh, MD  naproxen (NAPROSYN) 500 MG tablet Take 1 tablet (500 mg total) by mouth 2 (two) times daily. Patient not taking: Reported on 04/01/2017 11/19/16   Gloriann Loan, PA-C  predniSONE (STERAPRED UNI-PAK 21 TAB) 10 MG (21) TBPK tablet Take by mouth daily. Take 6 tabs by mouth daily  for 2 days, then 5 tabs for 2 days, then 4 tabs for 2 days, then 3 tabs for 2 days, 2 tabs for 2 days,  then 1 tab by mouth daily for 2 days 04/01/17   Lacretia Leigh, MD    Family History History reviewed. No pertinent family history.  Social History Social History   Tobacco Use   Smoking status: Every Day    Packs/day: 0.50    Types: Cigarettes   Smokeless tobacco: Never  Substance Use Topics   Alcohol use: Yes   Drug use: No     Allergies   Patient has no known allergies.   Review of Systems Review of Systems  Genitourinary:        Penile pump     Physical Exam Triage Vital Signs ED Triage Vitals  Enc Vitals Group     BP 12/13/22 1022 122/71     Pulse Rate 12/13/22 1022 75     Resp 12/13/22 1022 16     Temp 12/13/22 1022 97.7 F (36.5 C)     Temp Source 12/13/22 1022 Oral     SpO2 12/13/22 1022 95 %     Weight --      Height --      Head Circumference --      Peak Flow --      Pain Score 12/13/22 1021 0     Pain Loc --      Pain Edu? --  Excl. in GC? --    No data found.  Updated Vital Signs BP 122/71 (BP Location: Right Arm)   Pulse 75   Temp 97.7 F (36.5 C) (Oral)   Resp 16   SpO2 95%   Visual Acuity Right Eye Distance:   Left Eye Distance:   Bilateral Distance:    Right Eye Near:   Left Eye Near:    Bilateral Near:     Physical Exam Vitals and nursing note reviewed. 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  Constitutional:      Appearance: Normal appearance.  HENT:     Head: Normocephalic and atraumatic.  Eyes:     Pupils: Pupils are equal, round, and reactive to light.  Cardiovascular:     Rate and Rhythm: Normal rate.  Pulmonary:     Effort: Pulmonary effort is normal.  Genitourinary:   Skin:    General: Skin is warm and dry.  Neurological:     General: No focal deficit present.     Mental Status: He is alert and oriented to person, place, and time.  Psychiatric:        Mood and Affect: Mood normal.        Behavior: Behavior normal.      UC Treatments / Results  Labs (all labs ordered are listed, but only abnormal  results are displayed) Labs Reviewed  HSV CULTURE AND TYPING  RPR  HIV ANTIBODY (ROUTINE TESTING W REFLEX)  CYTOLOGY, (ORAL, ANAL, URETHRAL) ANCILLARY ONLY    EKG   Radiology No results found.  Procedures Procedures (including critical care time)  Medications Ordered in UC Medications - No data to display  Initial Impression / Assessment and Plan / UC Course  I have reviewed the triage vital signs and the nursing notes.  Pertinent labs & imaging results that were available during my care of the patient were reviewed by me and considered in my medical decision making (see chart for details).     Exam and symptoms with patient. HSV swab done of penile lesions.  Well does not appear as typical presentation will rule out Other STD testing as ordered above and will contact if positive results Advised follow-up with PCP as needed Final Clinical Impressions(s) / UC Diagnoses   Final diagnoses:  Penile lesion  Screening examination for STD (sexually transmitted disease)     Discharge Instructions      The clinical contact you for any positive results Follow-up with your PCP as needed   ED Prescriptions   None    PDMP not reviewed this encounter.   Melynda Ripple, NP 12/13/22 1052

## 2022-12-15 LAB — CYTOLOGY, (ORAL, ANAL, URETHRAL) ANCILLARY ONLY
Chlamydia: NEGATIVE
Comment: NEGATIVE
Comment: NEGATIVE
Comment: NORMAL
Neisseria Gonorrhea: NEGATIVE
Trichomonas: POSITIVE — AB

## 2022-12-15 LAB — HIV ANTIBODY (ROUTINE TESTING W REFLEX): HIV Screen 4th Generation wRfx: NONREACTIVE

## 2022-12-16 ENCOUNTER — Telehealth (HOSPITAL_COMMUNITY): Payer: Self-pay | Admitting: Emergency Medicine

## 2022-12-16 LAB — HSV CULTURE AND TYPING

## 2022-12-16 LAB — RPR: RPR Ser Ql: NONREACTIVE

## 2022-12-16 MED ORDER — METRONIDAZOLE 500 MG PO TABS: 2000.0000 mg | ORAL_TABLET | Freq: Once | ORAL | 0 refills | Status: AC

## 2022-12-30 ENCOUNTER — Ambulatory Visit
Admission: EM | Admit: 2022-12-30 | Discharge: 2022-12-30 | Disposition: A | Discharge status: Home / Self Care | Payer: Self-pay | Attending: Urgent Care | Admitting: Urgent Care

## 2022-12-30 DIAGNOSIS — R0789 Other chest pain: Secondary | ICD-10-CM | POA: Insufficient documentation

## 2022-12-30 DIAGNOSIS — Z8619 Personal history of other infectious and parasitic diseases: Secondary | ICD-10-CM | POA: Insufficient documentation

## 2022-12-30 DIAGNOSIS — B349 Viral infection, unspecified: Secondary | ICD-10-CM

## 2022-12-30 DIAGNOSIS — Z1152 Encounter for screening for COVID-19: Secondary | ICD-10-CM | POA: Insufficient documentation

## 2022-12-30 DIAGNOSIS — Z8709 Personal history of other diseases of the respiratory system: Secondary | ICD-10-CM | POA: Insufficient documentation

## 2022-12-30 DIAGNOSIS — J069 Acute upper respiratory infection, unspecified: Secondary | ICD-10-CM | POA: Insufficient documentation

## 2022-12-30 LAB — POCT INFLUENZA A/B
Influenza A, POC: NEGATIVE
Influenza B, POC: NEGATIVE

## 2022-12-30 MED ORDER — PREDNISONE 20 MG PO TABS: ORAL_TABLET | ORAL | 0 refills | Status: AC

## 2022-12-30 MED ORDER — CETIRIZINE HCL 10 MG PO TABS: 10.0000 mg | ORAL_TABLET | Freq: Every day | ORAL | 0 refills | Status: AC

## 2022-12-30 MED ORDER — PROMETHAZINE-DM 6.25-15 MG/5ML PO SYRP: 5.0000 mL | ORAL_SOLUTION | Freq: Three times a day (TID) | ORAL | 0 refills | Status: AC | PRN

## 2022-12-30 NOTE — ED Provider Notes (Signed)
[facility]  Note:  This document was prepared using Systems analyst and may include unintentional dictation errors.  MRN: [id number] DOB: 09-05-1991  Subjective:   Darrell Garcia is a 32 y.o. male presenting for acute onset since this morning of fever, body pains, headache, chest tightness, chest congestion, coughing.  Has concerns about pneumonia and bronchitis.  Has a history of the latter.  He did quit vaping.  Still smokes marijuana on occasion but not daily.  No history of asthma.  Patient would also like a recheck of trichomonas infection.  He tested positive for this at the end of January, took metronidazole 2g.  Denies dysuria, hematuria, urinary frequency, penile discharge, penile swelling, testicular pain, testicular swelling, anal pain, groin pain.   No chronic medications.    No Known Allergies  Past Medical History:  Diagnosis Date   Bronchitis      Past Surgical History:  Procedure Laterality Date   CYST EXCISION      No family history on file.  Social History   Tobacco Use   Smoking status: Former    Packs/day: 0.50    Types: Cigarettes   Smokeless tobacco: Never  Vaping Use   Vaping Use: Every day  Substance Use Topics   Alcohol use: Yes    Comment: weekly   Drug use: Yes    Types: Marijuana    ROS   Objective:   Vitals: BP (!) 129/91 (BP Location: Right Arm)   Pulse 77   Temp 99.4 F (37.4 C) (Oral)   Resp 18   SpO2 97%   Physical Exam Constitutional:      General: He is not in acute distress.    Appearance: Normal appearance. He is well-developed and normal weight. He is not ill-appearing, toxic-appearing or diaphoretic.  HENT:     Head: Normocephalic and atraumatic.     Right Ear: External ear normal.     Left Ear: External ear normal.     Nose: Congestion present. No rhinorrhea.     Mouth/Throat:     Mouth: Mucous membranes are moist.     Pharynx: No pharyngeal swelling, oropharyngeal  exudate, posterior oropharyngeal erythema or uvula swelling.     Tonsils: No tonsillar exudate or tonsillar abscesses. 0 on the right. 0 on the left.  Eyes:     General: No scleral icterus.       Right eye: No discharge.        Left eye: No discharge.     Extraocular Movements: Extraocular movements intact.  Cardiovascular:     Rate and Rhythm: Normal rate and regular rhythm.     Heart sounds: Normal heart sounds. No murmur heard.    No friction rub. No gallop.  Pulmonary:     Effort: Pulmonary effort is normal. No respiratory distress.     Breath sounds: Normal breath sounds. No stridor. No wheezing, rhonchi or rales.  Musculoskeletal:     Cervical back: Normal range of motion.  Neurological:     Mental Status: He is alert and oriented to person, place, and time.  Psychiatric:        Mood and Affect: Mood normal.        Behavior: Behavior normal.        Thought Content: Thought content normal.        Judgment: Judgment normal.     Results for orders placed or performed during the hospital encounter of 12/30/22 (from the past 24 hour(s))  POCT Influenza A/B     Status: None   Collection Time: 12/30/22  3:14 PM  Result Value Ref Range   Influenza A, POC Negative Negative   Influenza B, POC Negative Negative     Assessment and Plan :   PDMP not reviewed this encounter.  1. Acute viral syndrome   2. History of bronchitis   3. History of trichomoniasis     Advised patient that it is not recommended at this time to do a retest on his trichomonas but patient insisted on it.  Labs pending.  Given his history of bronchitis, marijuana use recommended an oral prednisone course.  This can help as a respiratory boost for his significant symptoms.  Will manage for viral illness such as viral URI, viral syndrome, viral rhinitis, COVID-19. Recommended supportive care. Offered scripts for symptomatic relief. Testing is pending. Counseled patient on potential for adverse effects with  medications prescribed/recommended today, ER and return-to-clinic precautions discussed, patient verbalized understanding.     Jaynee Eagles, [place] 12/30/22 1541

## 2022-12-30 NOTE — ED Triage Notes (Signed)
Pt c/o fever, body aches started yesterday, chest tightness-cough x today, pain across forehead/eyes-NAD-steady gait

## 2022-12-30 NOTE — Discharge Instructions (Addendum)
We will notify you of your test results as they arrive and may take between about 24 hours.  I encourage you to sign up for MyChart if you have not already done so as this can be the easiest way for [place] to communicate results to you online or through a phone app.  Generally, we only contact you if it is a positive test result.  In the meantime, if you develop worsening symptoms including fever, chest pain, shortness of breath despite our current treatment plan then please report to the emergency room as this may be a sign of worsening status from possible viral infection.  Otherwise, we will manage this as a viral syndrome. For sore throat or cough try using a honey-based tea. Use 3 teaspoons of honey with juice squeezed from half lemon. Place shaved pieces of ginger into 1/2-1 cup of water and warm over stove top. Then mix the ingredients and repeat every 4 hours as needed. Please take Tylenol 567m-650mg every 6 hours for aches and pains, fevers. Hydrate very well with at least 2 liters of water. Eat light meals such as soups to replenish electrolytes and soft fruits, veggies. Start an antihistamine like Zyrtec (132mdaily) for postnasal drainage, sinus congestion.  You can take this together with prednisone. Use cough medications.

## 2022-12-31 LAB — CYTOLOGY, (ORAL, ANAL, URETHRAL) ANCILLARY ONLY
Comment: NEGATIVE
Trichomonas: NEGATIVE

## 2022-12-31 LAB — SARS CORONAVIRUS 2 (TAT 6-24 HRS): SARS Coronavirus 2: NEGATIVE

## 2024-05-30 ENCOUNTER — Ambulatory Visit
Admission: EM | Admit: 2024-05-30 | Discharge: 2024-05-30 | Disposition: A | Discharge status: Home / Self Care | Attending: Family Medicine | Admitting: Family Medicine

## 2024-05-30 DIAGNOSIS — Z113 Encounter for screening for infections with a predominantly sexual mode of transmission: Secondary | ICD-10-CM | POA: Diagnosis not present

## 2024-05-30 DIAGNOSIS — N342 Other urethritis: Secondary | ICD-10-CM | POA: Insufficient documentation

## 2024-05-30 DIAGNOSIS — R21 Rash and other nonspecific skin eruption: Secondary | ICD-10-CM | POA: Diagnosis not present

## 2024-05-30 MED ORDER — DOXYCYCLINE HYCLATE 100 MG PO CAPS: 100.0000 mg | ORAL_CAPSULE | Freq: Two times a day (BID) | ORAL | 0 refills | Status: AC

## 2024-05-30 MED ORDER — CEFTRIAXONE SODIUM 500 MG IJ SOLR
500.0000 mg | INTRAMUSCULAR | Status: DC
  Administered 2024-05-30: 500 mg via INTRAMUSCULAR

## 2024-05-30 NOTE — ED Triage Notes (Signed)
 Pt c/o penile d/c x 5 days and a bump on the head of penis noticed ~1 week-NAD-steady gait

## 2024-05-30 NOTE — Discharge Instructions (Addendum)
 Avoid all forms of sexual intercourse (oral, vaginal, anal) for the next 7 days to avoid spreading/reinfecting or at least until we can see what kinds of infection results are positive.  Abstaining for 2 weeks would be better but at least 1 week is required.  We will let you know about your test results from the swab we did today and if you need any prescriptions for antibiotics or changes to your treatment from today.

## 2024-05-30 NOTE — ED Provider Notes (Signed)
 [facility]  Note:  This document was prepared using Conservation officer, historic buildings and may include unintentional dictation errors.  MRN: [id number] DOB: [birth year]  Subjective:   Darrell Garcia is a 33 y.o. male presenting for 5 day history of acute onset penile discharge, penile lesion over left shaft. Denies dysuria, hematuria, urinary frequency, penile swelling, testicular pain, testicular swelling, anal pain, groin pain. Would like complete STI testing including a throat swab and blood work.    No current facility-administered medications for this encounter.  Current Outpatient Medications:    cetirizine  (ZYRTEC  ALLERGY) 10 MG tablet, Take 1 tablet (10 mg total) by mouth daily., Disp: 30 tablet, Rfl: 0   predniSONE  (DELTASONE ) 20 MG tablet, Take 2 tablets daily with breakfast., Disp: 10 tablet, Rfl: 0   promethazine -dextromethorphan (PROMETHAZINE -DM) 6.25-15 MG/5ML syrup, Take 5 mLs by mouth 3 (three) times daily as needed for cough., Disp: 200 mL, Rfl: 0   No Known Allergies  Past Medical History:  Diagnosis Date   Bronchitis      Past Surgical History:  Procedure Laterality Date   CYST EXCISION      No family history on file.  Social History   Tobacco Use   Smoking status: Former    Current packs/day: 0.50    Types: Cigarettes   Smokeless tobacco: Never  Vaping Use   Vaping status: Former  Substance Use Topics   Alcohol use: Yes    Comment: weekly   Drug use: Not Currently    Types: Marijuana    ROS   Objective:   Vitals: BP 120/85 (BP Location: Right Arm)   Pulse 83   Temp 98.8 F (37.1 C) (Oral)   Resp 20   SpO2 96%   Physical Exam Constitutional:      General: He is not in acute distress.    Appearance: Normal appearance. He is well-developed and normal weight. He is not ill-appearing, toxic-appearing or diaphoretic.  HENT:     Head: Normocephalic and atraumatic.     Right Ear: External ear normal.     Left Ear:  External ear normal.     Nose: Nose normal.     Mouth/Throat:     Pharynx: Oropharynx is clear. No pharyngeal swelling, oropharyngeal exudate, posterior oropharyngeal erythema or uvula swelling.     Tonsils: No tonsillar exudate or tonsillar abscesses. 0 on the right. 0 on the left.  Eyes:     General: No scleral icterus.       Right eye: No discharge.        Left eye: No discharge.     Extraocular Movements: Extraocular movements intact.  Cardiovascular:     Rate and Rhythm: Normal rate.  Pulmonary:     Effort: Pulmonary effort is normal.  Genitourinary:    Penis: Circumcised. Discharge present. No phimosis, paraphimosis, hypospadias, erythema, tenderness, swelling or lesions.     Musculoskeletal:     Cervical back: Normal range of motion.  Neurological:     Mental Status: He is alert and oriented to person, place, and time.  Psychiatric:        Mood and Affect: Mood normal.        Behavior: Behavior normal.        Thought Content: Thought content normal.        Judgment: Judgment normal.    IM ceftriaxone  500mg  administered in clinic.   Assessment and Plan :   PDMP not reviewed this encounter.  1. Urethritis  2. Screen for STD (sexually transmitted disease)   3. Penile rash    Patient treated empirically as per CDC guidelines with IM ceftriaxone , doxycycline  as an outpatient.  Labs pending.   Counseled on safe sex practices including abstaining for 1 week following treatment.  Counseled patient on potential for adverse effects with medications prescribed/recommended today, ER and return-to-clinic precautions discussed, patient verbalized understanding.    Christopher Savannah, [place] 05/30/24 [id number]

## 2024-05-31 LAB — CYTOLOGY, (ORAL, ANAL, URETHRAL) ANCILLARY ONLY
Chlamydia: NEGATIVE
Chlamydia: POSITIVE — AB
Comment: NEGATIVE
Comment: NEGATIVE
Comment: NEGATIVE
Comment: NORMAL
Comment: NORMAL
Neisseria Gonorrhea: POSITIVE — AB
Neisseria Gonorrhea: POSITIVE — AB
Trichomonas: NEGATIVE

## 2024-05-31 LAB — HSV 1/2 PCR (SURFACE)
HSV-1 DNA: NOT DETECTED
HSV-2 DNA: NOT DETECTED

## 2024-05-31 LAB — RPR: RPR Ser Ql: NONREACTIVE

## 2024-05-31 LAB — HIV ANTIBODY (ROUTINE TESTING W REFLEX): HIV Screen 4th Generation wRfx: NONREACTIVE

## 2024-06-03 ENCOUNTER — Ambulatory Visit (HOSPITAL_COMMUNITY): Payer: Self-pay

## 2024-06-24 DIAGNOSIS — Z114 Encounter for screening for human immunodeficiency virus [HIV]: Secondary | ICD-10-CM | POA: Diagnosis not present

## 2024-06-24 DIAGNOSIS — Z113 Encounter for screening for infections with a predominantly sexual mode of transmission: Secondary | ICD-10-CM | POA: Diagnosis not present

## 2024-06-24 DIAGNOSIS — L739 Follicular disorder, unspecified: Secondary | ICD-10-CM | POA: Diagnosis not present
